# Patient Record
Sex: Male | Born: 1990 | Race: White | Hispanic: No | Marital: Married | Smoking: Current every day smoker
Health system: Southern US, Community
[De-identification: ages and names within clinical notes are randomized; demographics above are authoritative.]

---

## 2010-08-26 ENCOUNTER — Emergency Department (HOSPITAL_COMMUNITY): Admission: EM | Admit: 2010-08-26 | Discharge: 2010-08-26 | Payer: Self-pay | Admitting: Emergency Medicine

## 2011-01-01 LAB — BASIC METABOLIC PANEL
BUN: 10 mg/dL (ref 6–23)
CO2: 24 mEq/L (ref 19–32)
Calcium: 9 mg/dL (ref 8.4–10.5)
Chloride: 97 mEq/L (ref 96–112)
Creatinine, Ser: 0.94 mg/dL (ref 0.4–1.5)
GFR calc Af Amer: >60 mL/min (ref >60)

## 2011-01-01 LAB — ETHANOL: Alcohol, Ethyl (B): <5 mg/dL (ref 0–10)

## 2011-01-01 LAB — DIFFERENTIAL
Basophils Absolute: 0 10*3/uL (ref 0.0–0.1)
Basophils Relative: 0 % (ref 0–1)
Eosinophils Absolute: 0.1 10*3/uL (ref 0.0–0.7)
Eosinophils Relative: 1 % (ref 0–5)
Lymphs Abs: 1.5 10*3/uL (ref 0.7–4.0)
Neutrophils Relative %: 83 % — ABNORMAL HIGH (ref 43–77)

## 2011-01-01 LAB — RAPID URINE DRUG SCREEN, HOSP PERFORMED
Barbiturates: NOT DETECTED
Opiates: NOT DETECTED

## 2011-01-01 LAB — CBC
MCH: 31.3 pg (ref 26.0–34.0)
MCV: 90.8 fL (ref 78.0–100.0)
Platelets: 219 10*3/uL (ref 150–400)
RBC: 4.94 MIL/uL (ref 4.22–5.81)
RDW: 12.6 % (ref 11.5–15.5)

## 2011-09-11 IMAGING — CT CT CERVICAL SPINE W/O CM
3 of 6 series · 11 of 33 positions shown, 13 images · non-contrast
Comparison: None.

CT HEAD

CLINICAL DATA: Loss of consciousness, found at bottom of stairs.
Unknown trauma.

CT HEAD WITHOUT CONTRAST
CT CERVICAL SPINE WITHOUT CONTRAST
TECHNIQUE: Multidetector CT imaging of the head and cervical spine
was performed following the standard protocol without intravenous
contrast.  Multiplanar CT image reconstructions of the cervical
spine were also generated.

[Series 5: cervical st 2.0 b31s · axial · 0.23mm/px · z∈[+1060,+1186]mm · 3 of 105 slices shown, 4 images]
[im 21/105  soft-tissue]
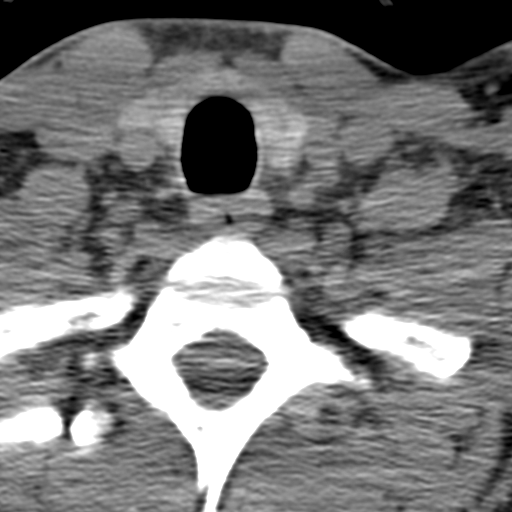
[im 21/105  bone]
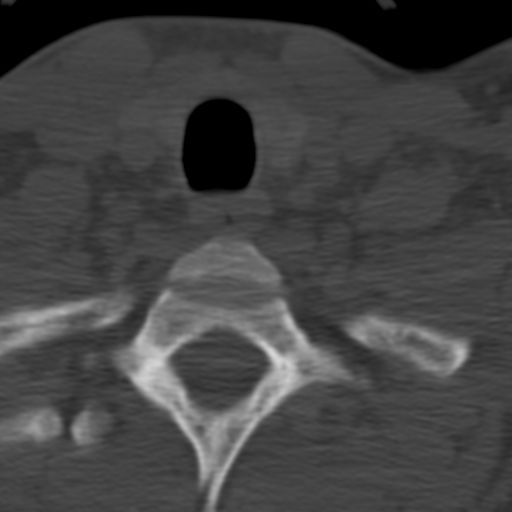
[im 63/105  bone]
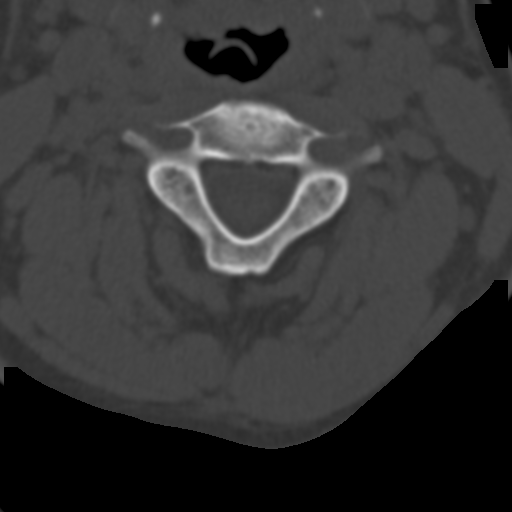
[im 84/105  bone]
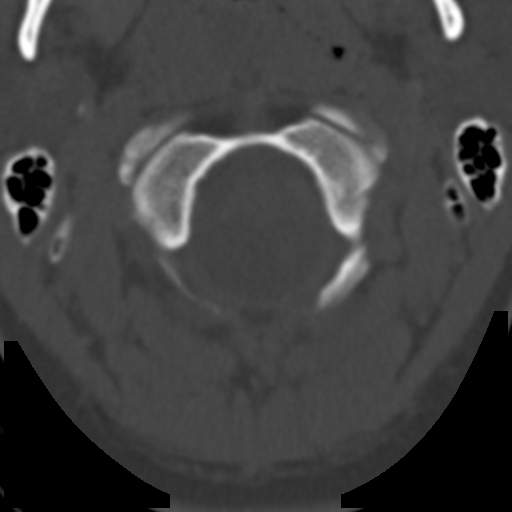

[Series 7: sagittal bone 2.0 · sagittal · 0.24mm/px · 5 of 57 slices shown, 6 images]
[im 19/57  bone]
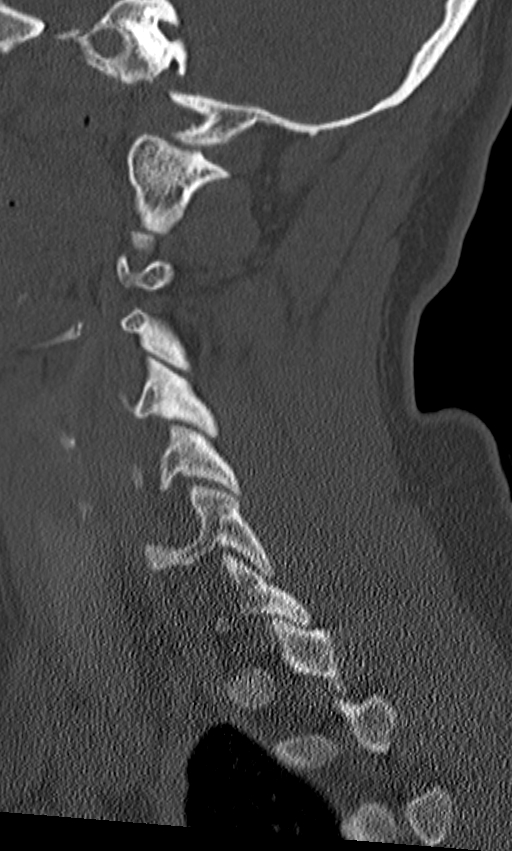
[im 24/57  bone]
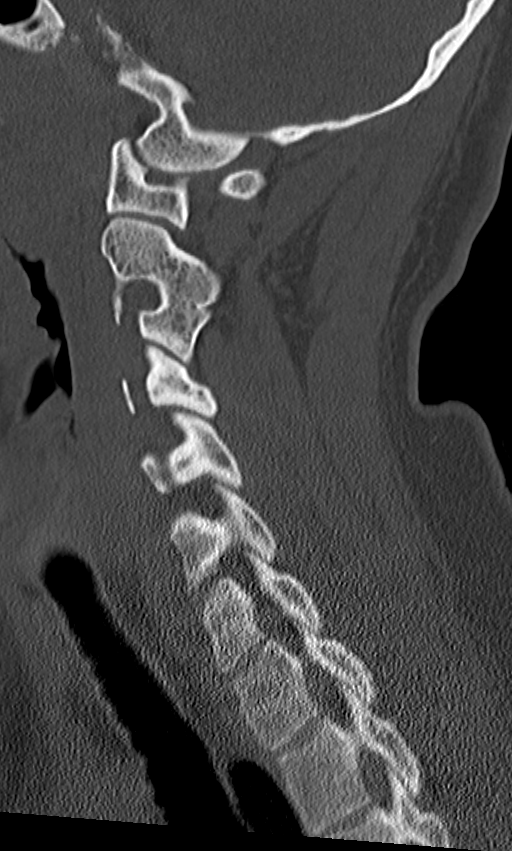
[im 29/57  soft-tissue]
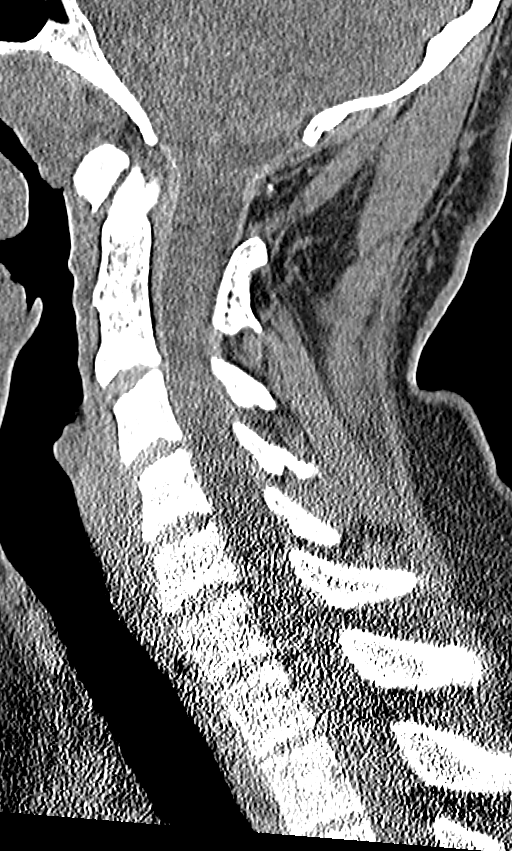
[im 29/57  bone]
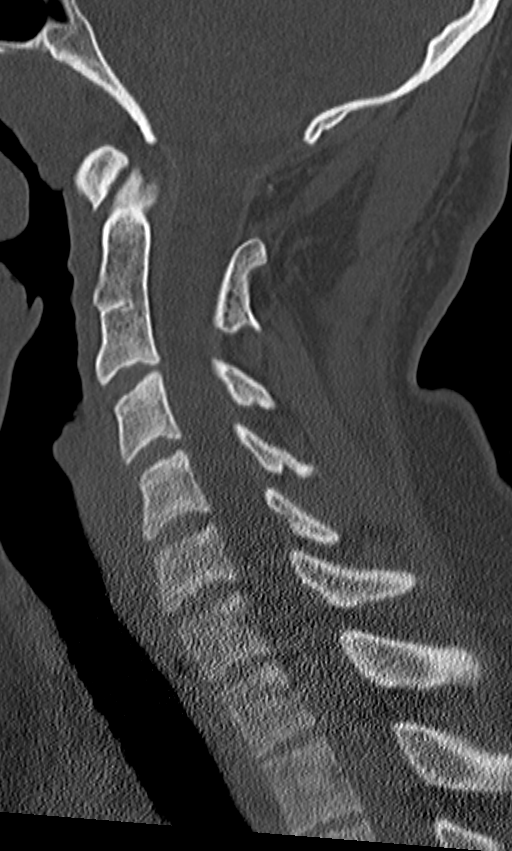
[im 33/57  bone]
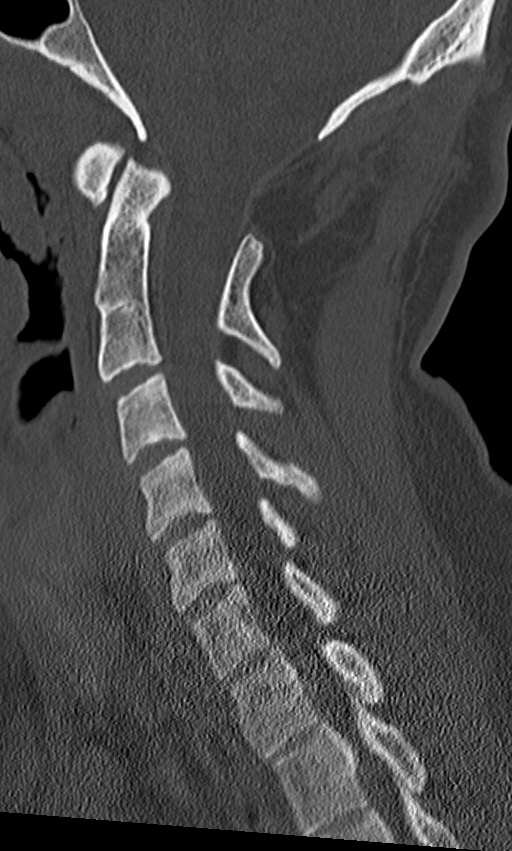
[im 38/57  bone]
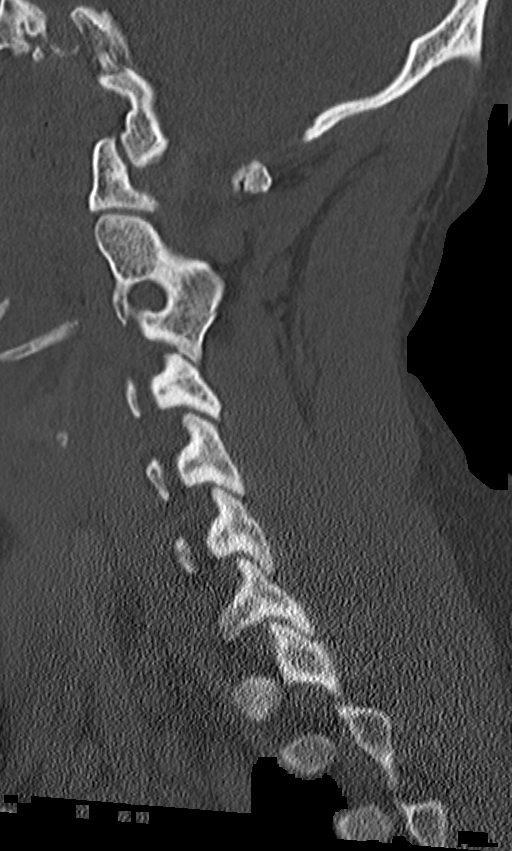

[Series 8: coronal bone 2.0 · coronal · 0.22mm/px · 3 of 48 slices shown]
[im 10/48  bone]
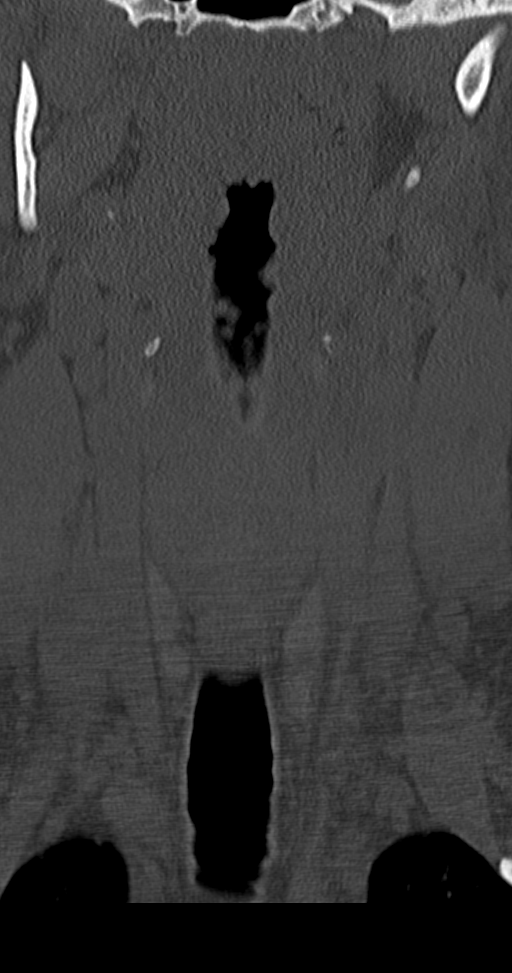
[im 19/48  bone]
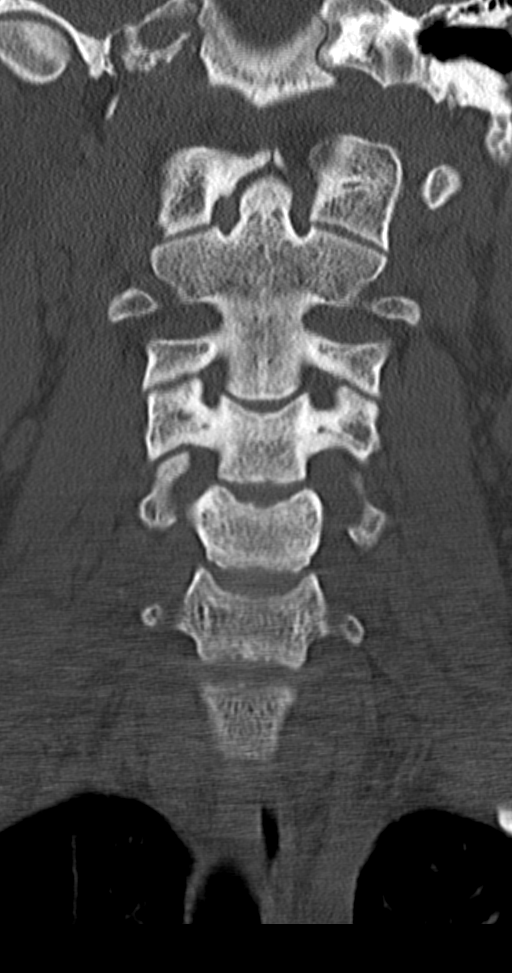
[im 29/48  bone]
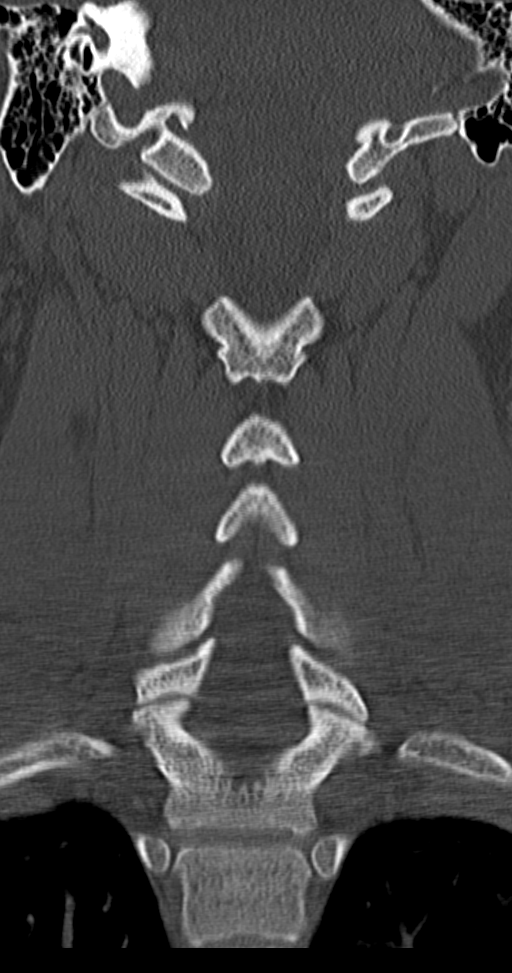

[11 of 33 positions shown; findings below may reference images not displayed]

FINDINGS: No acute hemorrhage, acute infarction, or mass lesion is
identified.  No midline shift.  No ventriculomegaly.  No skull
fracture.  Ethmoid sinus mucoperiosteal thickening noted.  Sphenoid
sinus mucous retention cysts or polyps noted.  No skull fracture.
Right external auditory canals lumen noted.
IMPRESSION: No acute intracranial finding.

CT CERVICAL SPINE
FINDINGS: C1 through the cervical thoracic junction is visualized
in its entirety.  Congenital fusion of C2 and C3 incidentally
noted. No precervical soft tissue widening is present.  Normal
alignment.  No fracture or dislocation.
IMPRESSION: Normal exam.  Congenital C2-C3 fusion incidentally noted.

## 2014-02-10 ENCOUNTER — Ambulatory Visit (INDEPENDENT_AMBULATORY_CARE_PROVIDER_SITE_OTHER): Payer: BC Managed Care – PPO | Admitting: Family Medicine

## 2014-02-10 ENCOUNTER — Encounter: Payer: Self-pay | Admitting: Family Medicine

## 2014-02-10 VITALS — BP 134/90 | Ht 69.5 in | Wt 260.0 lb

## 2014-02-10 DIAGNOSIS — K219 Gastro-esophageal reflux disease without esophagitis: Secondary | ICD-10-CM

## 2014-02-10 MED ORDER — PANTOPRAZOLE SODIUM 40 MG PO TBEC: 40.0000 mg | DELAYED_RELEASE_TABLET | Freq: Every day | ORAL | Status: AC

## 2014-02-10 NOTE — Patient Instructions (Signed)
Gastroesophageal Reflux Disease, Adult  Gastroesophageal reflux disease (GERD) happens when acid from your stomach flows up into the esophagus. When acid comes in contact with the esophagus, the acid causes soreness (inflammation) in the esophagus. Over time, GERD may create small holes (ulcers) in the lining of the esophagus.  CAUSES   · Increased body weight. This puts pressure on the stomach, making acid rise from the stomach into the esophagus.  · Smoking. This increases acid production in the stomach.  · Drinking alcohol. This causes decreased pressure in the lower esophageal sphincter (valve or ring of muscle between the esophagus and stomach), allowing acid from the stomach into the esophagus.  · Late evening meals and a full stomach. This increases pressure and acid production in the stomach.  · A malformed lower esophageal sphincter.  Sometimes, no cause is found.  SYMPTOMS   · Burning pain in the lower part of the mid-chest behind the breastbone and in the mid-stomach area. This may occur twice a week or more often.  · Trouble swallowing.  · Sore throat.  · Dry cough.  · Asthma-like symptoms including chest tightness, shortness of breath, or wheezing.  DIAGNOSIS   Your caregiver may be able to diagnose GERD based on your symptoms. In some cases, X-rays and other tests may be done to check for complications or to check the condition of your stomach and esophagus.  TREATMENT   Your caregiver may recommend over-the-counter or prescription medicines to help decrease acid production. Ask your caregiver before starting or adding any new medicines.   HOME CARE INSTRUCTIONS   · Change the factors that you can control. Ask your caregiver for guidance concerning weight loss, quitting smoking, and alcohol consumption.  · Avoid foods and drinks that make your symptoms worse, such as:  · Caffeine or alcoholic drinks.  · Chocolate.  · Peppermint or mint flavorings.  · Garlic and onions.  · Spicy foods.  · Citrus fruits,  such as oranges, lemons, or limes.  · Tomato-based foods such as sauce, chili, salsa, and pizza.  · Fried and fatty foods.  · Avoid lying down for the 3 hours prior to your bedtime or prior to taking a nap.  · Eat small, frequent meals instead of large meals.  · Wear loose-fitting clothing. Do not wear anything tight around your waist that causes pressure on your stomach.  · Raise the head of your bed 6 to 8 inches with wood blocks to help you sleep. Extra pillows will not help.  · Only take over-the-counter or prescription medicines for pain, discomfort, or fever as directed by your caregiver.  · Do not take aspirin, ibuprofen, or other nonsteroidal anti-inflammatory drugs (NSAIDs).  SEEK IMMEDIATE MEDICAL CARE IF:   · You have pain in your arms, neck, jaw, teeth, or back.  · Your pain increases or changes in intensity or duration.  · You develop nausea, vomiting, or sweating (diaphoresis).  · You develop shortness of breath, or you faint.  · Your vomit is green, yellow, black, or looks like coffee grounds or blood.  · Your stool is red, bloody, or black.  These symptoms could be signs of other problems, such as heart disease, gastric bleeding, or esophageal bleeding.  MAKE SURE YOU:   · Understand these instructions.  · Will watch your condition.  · Will get help right away if you are not doing well or get worse.  Document Released: 07/17/2005 Document Revised: 12/30/2011 Document Reviewed: 04/26/2011  [employer]® Patient   Information ©2014 [employer].

## 2014-02-10 NOTE — Progress Notes (Signed)
   Subjective:    Patient ID: Franklin Lee, male    DOB: 02/12/1991, 23 y.o.   MRN: [id number]  Gastrophageal Reflux He complains of belching, coughing and globus sensation. This is a recurrent problem. The current episode started more than 1 month ago. The problem occurs constantly. The problem has been gradually worsening. The symptoms are aggravated by smoking, stress, lying down and certain foods. Risk factors include smoking/tobacco exposure. He has tried an antacid for the symptoms. The treatment provided no relief.   Pt has constant acid coming up  Feels it in the back of the throat  Excess flatulence  Sig burping and hiccuping, freq acid taste oin the mouth  One soda every couple days  otc tums not helping,  Smokes one third of a pack per day  As fiance  Upper mid abd is hard and bulgy   Review of Systems  Respiratory: Positive for cough.    no vomiting no diarrhea no chest pain no dysuria ROS otherwise negative     Objective:   Physical Exam Alert talkative no apparent distress. H&T normal. Lungs clear. Heart regular in rhythm. Epigastrium mild tenderness to deep palpation. No chest wall pain       Assessment & Plan:  Impression reflux possible element of gastritis plan proton pump inhibitor prescribed. Symptomatic care discussed. Warning signs discussed. WSL

## 2019-11-22 ENCOUNTER — Encounter: Payer: Self-pay | Admitting: Family Medicine
